# Patient Record
Sex: Female | Born: 2005 | Race: Black or African American | Hispanic: No | Marital: Single | Smoking: Never smoker
Health system: Southern US, Community
[De-identification: ages and names within clinical notes are randomized; demographics above are authoritative.]

---
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
## 2024-03-22 DIAGNOSIS — L309 Dermatitis, unspecified: Secondary | ICD-10-CM | POA: Diagnosis not present

## 2024-03-22 DIAGNOSIS — F4323 Adjustment disorder with mixed anxiety and depressed mood: Secondary | ICD-10-CM | POA: Diagnosis not present

## 2024-03-22 DIAGNOSIS — Z23 Encounter for immunization: Secondary | ICD-10-CM | POA: Diagnosis not present

## 2024-03-22 DIAGNOSIS — Z0000 Encounter for general adult medical examination without abnormal findings: Secondary | ICD-10-CM | POA: Diagnosis not present

## 2024-04-21 DIAGNOSIS — Z23 Encounter for immunization: Secondary | ICD-10-CM | POA: Diagnosis not present
